# Patient Record
Sex: Female | Born: 1995 | Race: White | Hispanic: No | Marital: Single | Smoking: Never smoker
Health system: Southern US, Community
[De-identification: ages and names within clinical notes are randomized; demographics above are authoritative.]

## PROBLEM LIST (undated history)

## (undated) DIAGNOSIS — R51 Headache: Secondary | ICD-10-CM

## (undated) DIAGNOSIS — F332 Major depressive disorder, recurrent severe without psychotic features: Secondary | ICD-10-CM

## (undated) DIAGNOSIS — F411 Generalized anxiety disorder: Secondary | ICD-10-CM

## (undated) DIAGNOSIS — G43909 Migraine, unspecified, not intractable, without status migrainosus: Secondary | ICD-10-CM

## (undated) DIAGNOSIS — T50902A Poisoning by unspecified drugs, medicaments and biological substances, intentional self-harm, initial encounter: Secondary | ICD-10-CM

## (undated) HISTORY — DX: Generalized anxiety disorder: F41.1

## (undated) HISTORY — DX: Major depressive disorder, recurrent severe without psychotic features: F33.2

## (undated) HISTORY — DX: Headache: R51

## (undated) HISTORY — DX: Poisoning by unspecified drugs, medicaments and biological substances, intentional self-harm, initial encounter: T50.902A

---
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
## 2018-01-24 IMAGING — CT CT HEAD W/O CM
1 series · 16 of 30 positions shown, 20 images · non-contrast
Comparison: None

CLINICAL DATA: Pale and nonverbal, altered mental status, took
zonegran, Elavil, Flexeril and Motrin

EXAM:
CT HEAD WITHOUT CONTRAST
TECHNIQUE: Contiguous axial images were obtained from the base of the skull
through the vertex without intravenous contrast.

[Series 2: head wo · axial · 0.43mm/px · z∈[-125,+30]mm · 16 of 36 slices shown, 20 images]
[im 2/36  brain]
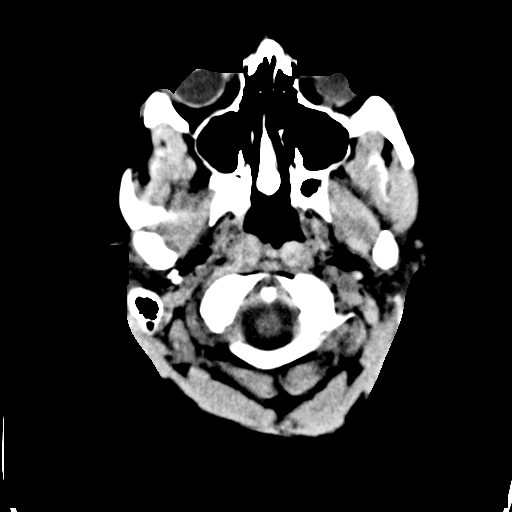
[im 2/36  bone]
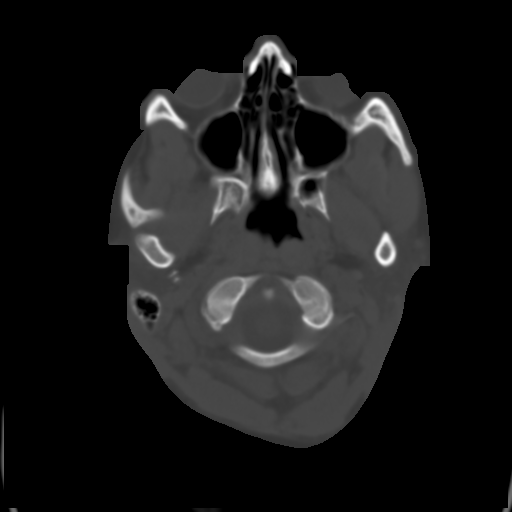
[im 4/36  brain]
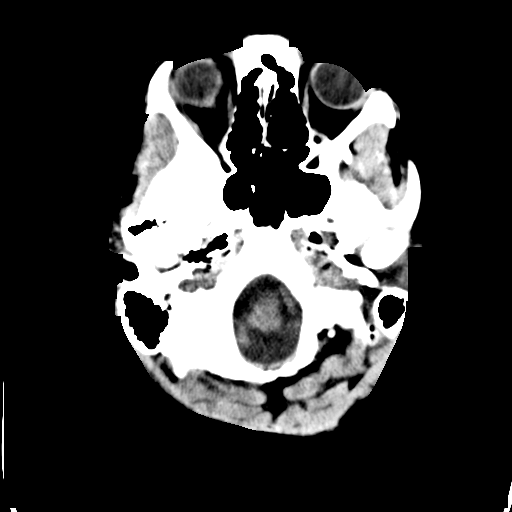
[im 7/36  brain]
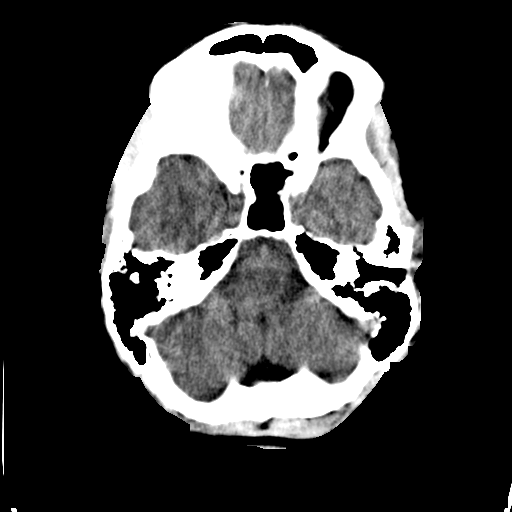
[im 9/36  brain]
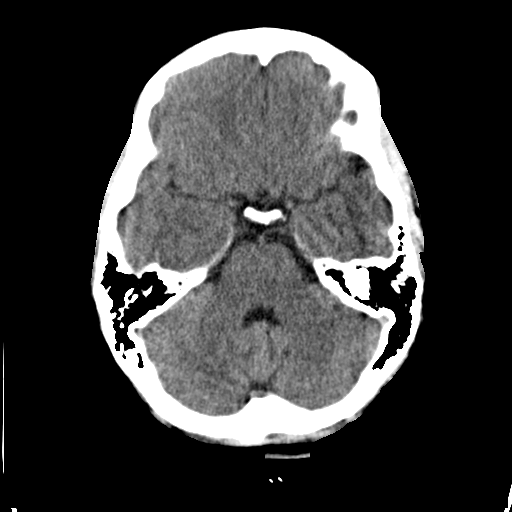
[im 10/36  brain]
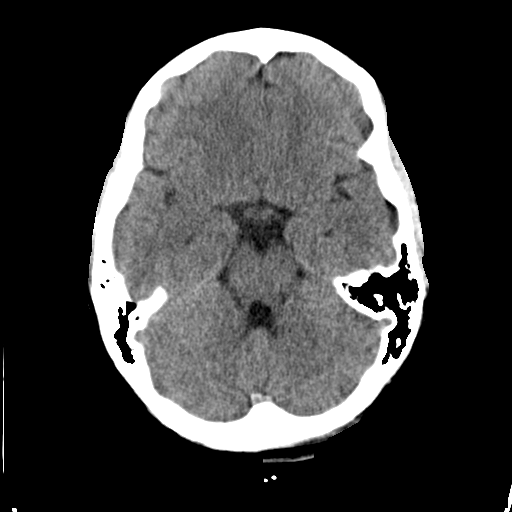
[im 10/36  bone]
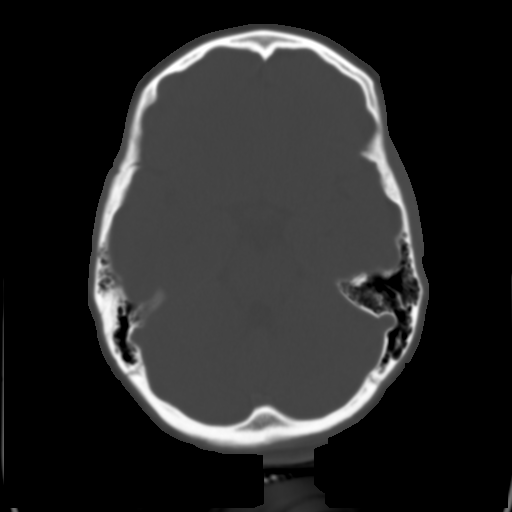
[im 13/36  brain]
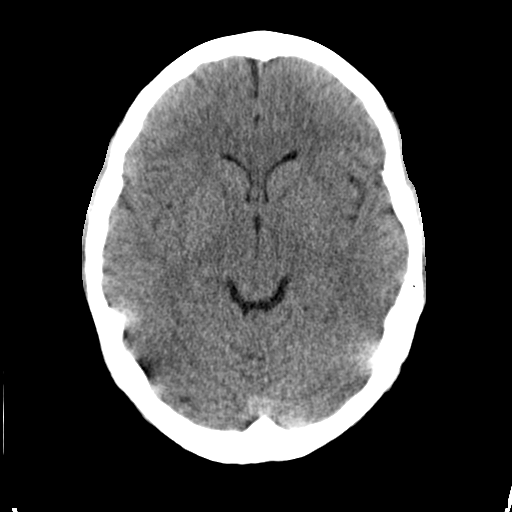
[im 15/36  brain]
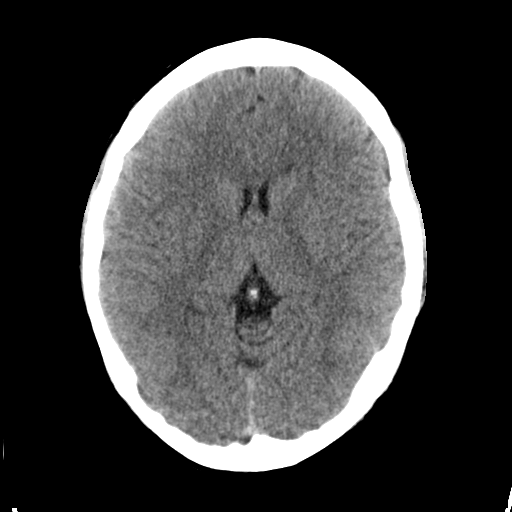
[im 17/36  brain]
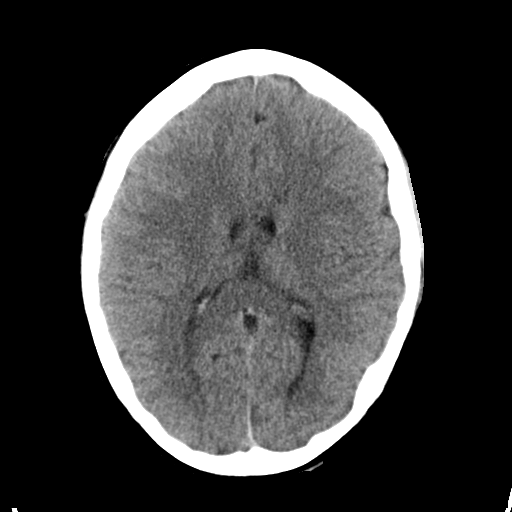
[im 19/36  brain]
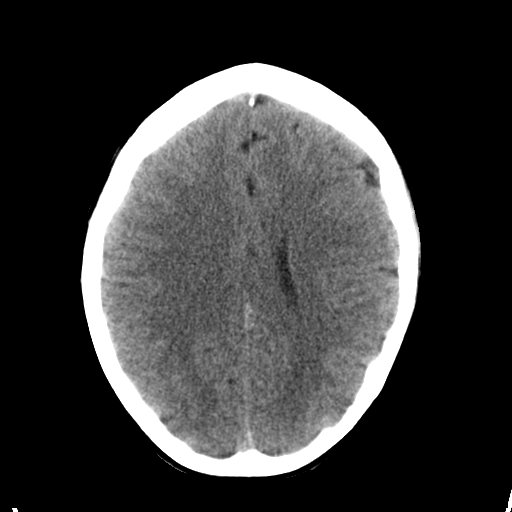
[im 19/36  bone]
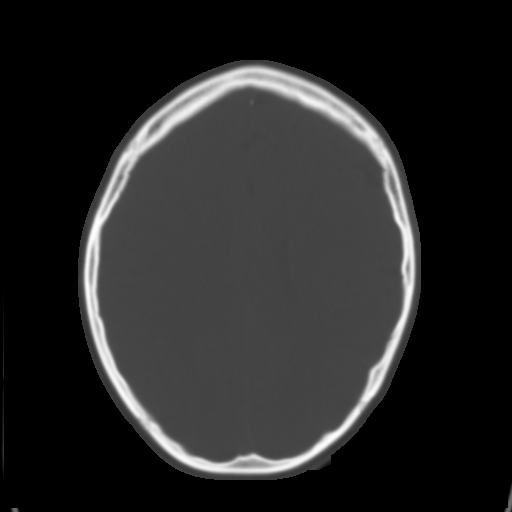
[im 21/36  brain]
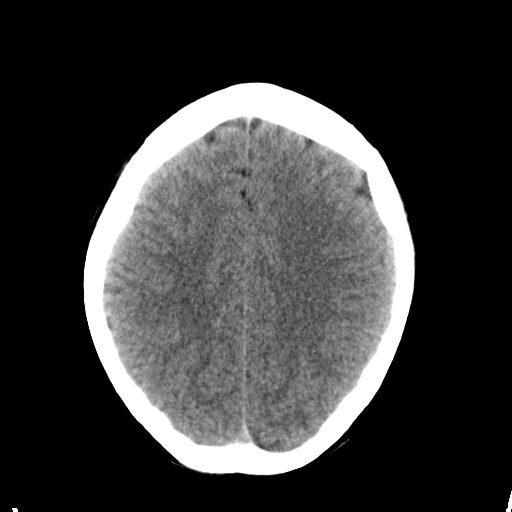
[im 23/36  brain]
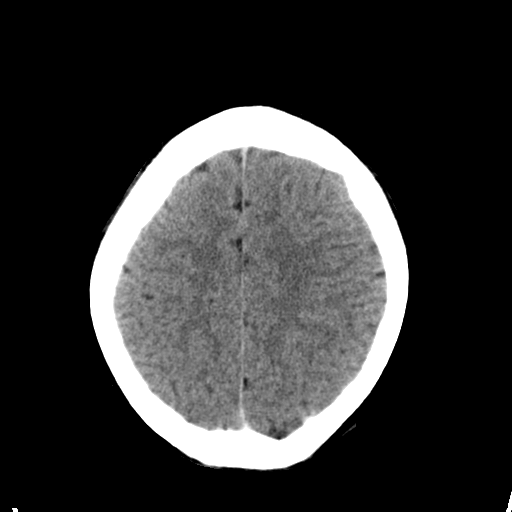
[im 26/36  brain]
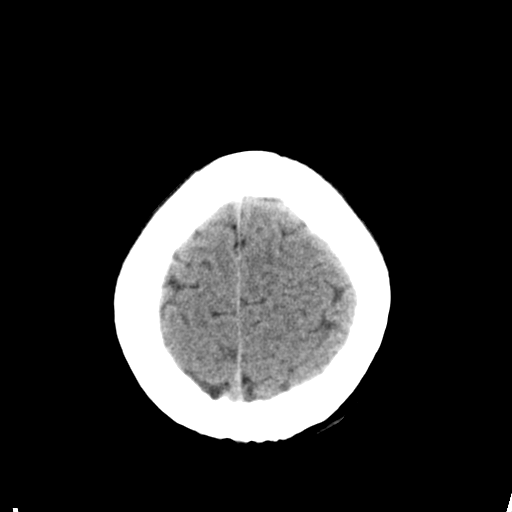
[im 27/36  brain]
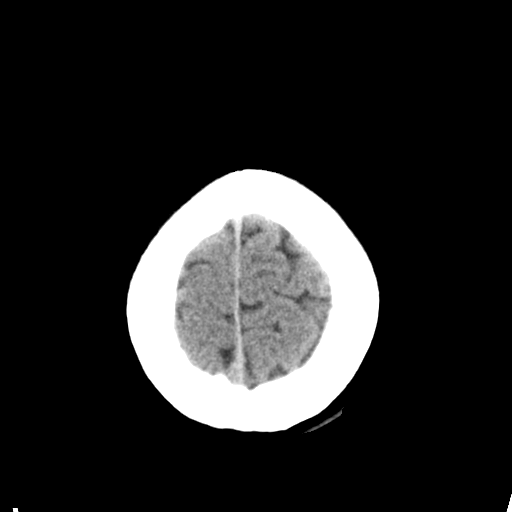
[im 27/36  bone]
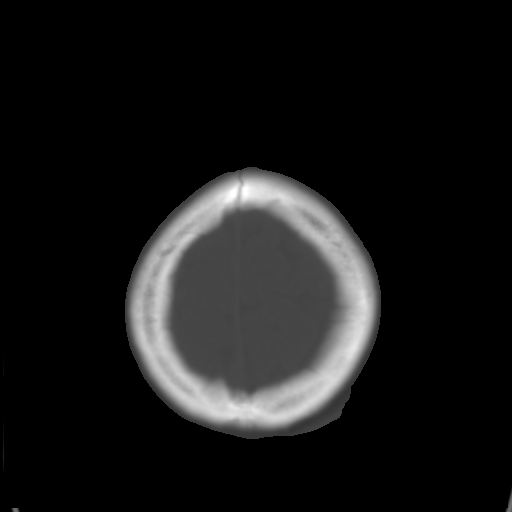
[im 29/36  brain]
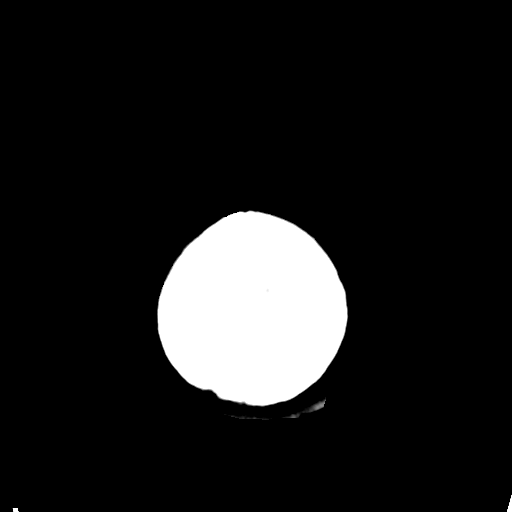
[im 32/36  brain]
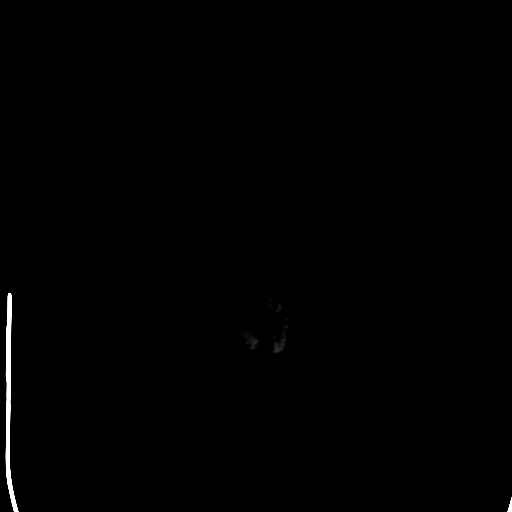
[im 34/36  brain]
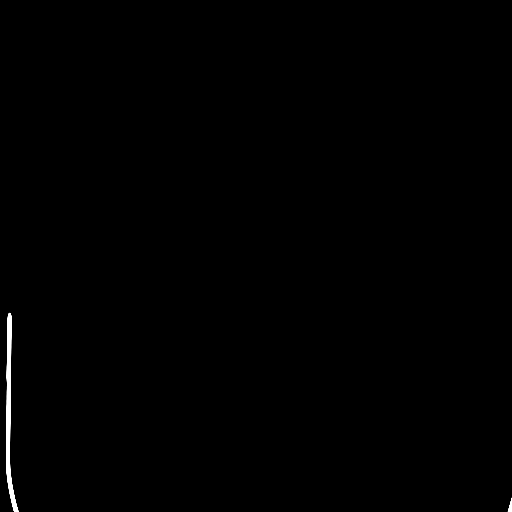

[16 of 30 positions shown; findings below may reference images not displayed]

FINDINGS: Normal ventricular morphology.

No midline shift or mass effect.

Normal appearance of brain parenchyma.

No intracranial hemorrhage, mass lesion or extra-axial fluid
collection.

Bones and sinuses unremarkable.
IMPRESSION: Normal exam.
